# Patient Record
Sex: Female | Born: 1960 | Race: White | Hispanic: No | Marital: Married | Smoking: Never smoker
Health system: Southern US, Community
[De-identification: ages and names within clinical notes are randomized; demographics above are authoritative.]

## PROBLEM LIST (undated history)

## (undated) DIAGNOSIS — E079 Disorder of thyroid, unspecified: Secondary | ICD-10-CM

## (undated) HISTORY — PX: CHOLECYSTECTOMY: SHX55

## (undated) HISTORY — PX: THYROIDECTOMY: SHX17

---

## 2015-07-03 DIAGNOSIS — E669 Obesity, unspecified: Secondary | ICD-10-CM

## 2015-07-03 DIAGNOSIS — J309 Allergic rhinitis, unspecified: Secondary | ICD-10-CM | POA: Insufficient documentation

## 2015-07-03 DIAGNOSIS — E039 Hypothyroidism, unspecified: Secondary | ICD-10-CM

## 2015-07-03 HISTORY — DX: Obesity, unspecified: E66.9

## 2015-07-03 HISTORY — DX: Hypothyroidism, unspecified: E03.9

## 2015-07-03 HISTORY — DX: Allergic rhinitis, unspecified: J30.9

## 2015-07-14 DIAGNOSIS — R072 Precordial pain: Secondary | ICD-10-CM

## 2015-07-14 DIAGNOSIS — E890 Postprocedural hypothyroidism: Secondary | ICD-10-CM

## 2015-07-14 DIAGNOSIS — K219 Gastro-esophageal reflux disease without esophagitis: Secondary | ICD-10-CM

## 2015-07-14 HISTORY — DX: Gastro-esophageal reflux disease without esophagitis: K21.9

## 2015-07-14 HISTORY — DX: Precordial pain: R07.2

## 2015-07-14 HISTORY — DX: Postprocedural hypothyroidism: E89.0

## 2017-08-25 DIAGNOSIS — F325 Major depressive disorder, single episode, in full remission: Secondary | ICD-10-CM

## 2017-08-25 HISTORY — DX: Major depressive disorder, single episode, in full remission: F32.5

## 2019-05-09 ENCOUNTER — Ambulatory Visit: Payer: Self-pay | Attending: Internal Medicine

## 2019-05-09 DIAGNOSIS — Z23 Encounter for immunization: Secondary | ICD-10-CM

## 2019-05-09 NOTE — Progress Notes (Signed)
   Covid-19 Vaccination Clinic  Name:  Judith Joseph    MRN: [id number] DOB: [birth year]  05/09/2019  [name] was observed post Covid-19 immunization for 15 minutes without incident. She was provided with Vaccine Information Sheet and instruction to access the V-Safe system.   [name] was instructed to call 911 with any severe reactions post vaccine: Marland Kitchen Difficulty breathing  . Swelling of face and throat  . A fast heartbeat  . A bad rash all over body  . Dizziness and weakness   Immunizations Administered    Name Date Dose VIS Date Route   Pfizer COVID-19 Vaccine 05/09/2019  2:56 PM 0.3 mL 02/08/2019 Intramuscular   Manufacturer: [employer], [employer]   Lot: [id number]   NDC: [address]-2

## 2019-06-04 ENCOUNTER — Ambulatory Visit: Payer: Self-pay | Attending: Internal Medicine

## 2019-06-04 DIAGNOSIS — Z23 Encounter for immunization: Secondary | ICD-10-CM

## 2019-06-04 NOTE — Progress Notes (Signed)
   Covid-19 Vaccination Clinic  Name:  Judith Joseph    MRN: [id number] DOB: [birth year]  06/04/2019  Judith Joseph was observed post Covid-19 immunization for 15 minutes without incident. She was provided with Vaccine Information Sheet and instruction to access the V-Safe system.   Judith Joseph was instructed to call 911 with any severe reactions post vaccine: Marland Kitchen Difficulty breathing  . Swelling of face and throat  . A fast heartbeat  . A bad rash all over body  . Dizziness and weakness   Immunizations Administered    Name Date Dose VIS Date Route   Pfizer COVID-19 Vaccine 06/04/2019  2:44 PM 0.3 mL 02/08/2019 Intramuscular   Manufacturer: [employer], [employer]   Lot: [id number]   NDC: [address]-0

## 2019-07-24 ENCOUNTER — Emergency Department (HOSPITAL_BASED_OUTPATIENT_CLINIC_OR_DEPARTMENT_OTHER): Payer: 59

## 2019-07-24 ENCOUNTER — Other Ambulatory Visit: Payer: Self-pay

## 2019-07-24 ENCOUNTER — Emergency Department (HOSPITAL_BASED_OUTPATIENT_CLINIC_OR_DEPARTMENT_OTHER)
Admission: EM | Admit: 2019-07-24 | Discharge: 2019-07-24 | Disposition: A | Discharge status: Home / Self Care | Payer: 59 | Attending: Emergency Medicine | Admitting: Emergency Medicine

## 2019-07-24 ENCOUNTER — Encounter (HOSPITAL_BASED_OUTPATIENT_CLINIC_OR_DEPARTMENT_OTHER): Payer: Self-pay | Admitting: Emergency Medicine

## 2019-07-24 DIAGNOSIS — I447 Left bundle-branch block, unspecified: Secondary | ICD-10-CM | POA: Insufficient documentation

## 2019-07-24 DIAGNOSIS — R079 Chest pain, unspecified: Secondary | ICD-10-CM | POA: Diagnosis present

## 2019-07-24 DIAGNOSIS — Z79899 Other long term (current) drug therapy: Secondary | ICD-10-CM | POA: Diagnosis not present

## 2019-07-24 DIAGNOSIS — Z7982 Long term (current) use of aspirin: Secondary | ICD-10-CM | POA: Insufficient documentation

## 2019-07-24 HISTORY — DX: Disorder of thyroid, unspecified: E07.9

## 2019-07-24 LAB — COMPREHENSIVE METABOLIC PANEL
ALT: 12 U/L (ref 0–44)
AST: 18 U/L (ref 15–41)
Albumin: 3.6 g/dL (ref 3.5–5.0)
Alkaline Phosphatase: 79 U/L (ref 38–126)
Anion gap: 12 (ref 5–15)
BUN: 14 mg/dL (ref 6–20)
CO2: 25 mmol/L (ref 22–32)
Calcium: 8.2 mg/dL — ABNORMAL LOW (ref 8.9–10.3)
Chloride: 100 mmol/L (ref 98–111)
Creatinine, Ser: 0.96 mg/dL (ref 0.44–1.00)
GFR calc Af Amer: >60 mL/min (ref >60)
GFR calc non Af Amer: >60 mL/min (ref >60)
Glucose, Bld: 98 mg/dL (ref 70–99)
Potassium: 3.4 mmol/L — ABNORMAL LOW (ref 3.5–5.1)
Sodium: 137 mmol/L (ref 135–145)
Total Bilirubin: 0.5 mg/dL (ref 0.3–1.2)
Total Protein: 6.4 g/dL — ABNORMAL LOW (ref 6.5–8.1)

## 2019-07-24 LAB — CBC WITH DIFFERENTIAL/PLATELET
Abs Immature Granulocytes: 0.01 10*3/uL (ref 0.00–0.07)
Basophils Absolute: 0.1 10*3/uL (ref 0.0–0.1)
Basophils Relative: 1 %
Eosinophils Absolute: 0.2 10*3/uL (ref 0.0–0.5)
Eosinophils Relative: 3 %
HCT: 36.5 % (ref 36.0–46.0)
Hemoglobin: 11.8 g/dL — ABNORMAL LOW (ref 12.0–15.0)
Immature Granulocytes: 0 %
Lymphocytes Relative: 45 %
Lymphs Abs: 3.2 10*3/uL (ref 0.7–4.0)
MCH: 26.7 pg (ref 26.0–34.0)
MCHC: 32.3 g/dL (ref 30.0–36.0)
MCV: 82.6 fL (ref 80.0–100.0)
Monocytes Absolute: 0.5 10*3/uL (ref 0.1–1.0)
Monocytes Relative: 7 %
Neutro Abs: 3.2 10*3/uL (ref 1.7–7.7)
Neutrophils Relative %: 44 %
Platelets: 340 10*3/uL (ref 150–400)
RBC: 4.42 MIL/uL (ref 3.87–5.11)
RDW: 13.5 % (ref 11.5–15.5)
WBC: 7.2 10*3/uL (ref 4.0–10.5)
nRBC: 0 % (ref 0.0–0.2)

## 2019-07-24 LAB — TROPONIN I (HIGH SENSITIVITY)
Troponin I (High Sensitivity): 3 ng/L (ref <18)
Troponin I (High Sensitivity): 4 ng/L (ref <18)

## 2019-07-24 MED ORDER — ASPIRIN 81 MG PO CHEW
324.0000 mg | CHEWABLE_TABLET | Freq: Once | ORAL | Status: AC
  Administered 2019-07-24: 324 mg via ORAL
  Filled 2019-07-24: qty 4

## 2019-07-24 MED ORDER — NITROGLYCERIN 0.4 MG SL SUBL
0.4000 mg | SUBLINGUAL_TABLET | SUBLINGUAL | Status: DC | PRN
  Administered 2019-07-24 (×3): 0.4 mg via SUBLINGUAL
  Filled 2019-07-24: qty 1

## 2019-07-24 NOTE — ED Notes (Signed)
MD informed of chest pain 4/10. Left side with movement. Denies jaw or arm pain.

## 2019-07-24 NOTE — ED Triage Notes (Signed)
Pt also states she had intermittent pain in legs with weakness in legs over last several days.

## 2019-07-24 NOTE — Discharge Instructions (Addendum)
Call the cardiology group this morning to schedule outpatient follow-up for further evaluation of your chest pain.  Continue to take your aspirin daily.  If you have any worsening chest pain symptoms please go to the ER at [facility] for repeat evaluation.

## 2019-07-24 NOTE — ED Provider Notes (Signed)
[facility] EMERGENCY DEPARTMENT Provider Note   CSN: [id number] Arrival date & time: 07/24/19  0112     History Chief Complaint  Patient presents with  . Chest Pain    Judith Joseph is a 59 y.o. female.  Patient presents to the emergency department for evaluation of left-sided chest pain that radiates to the left arm and into her left jaw.  Symptoms began 2 hours ago while she was sitting and watching TV.  She feels like she might be very slightly short of breath.  No pleuritic pain.  She did not get nausea or diaphoresis with the pain.  She has not noticed any worsening with exertion.  She reports that she is fairly active at work, walks throughout the course of the day.  Has never developed exertional chest pain but over the last couple of weeks has felt more fatigued than usual and intermittently has been experiencing aching pains in her legs.     HPI: A 59 year old patient with a history of obesity presents for evaluation of chest pain. Initial onset of pain was approximately 1-3 hours ago. The patient's chest pain is not worse with exertion. The patient's chest pain is middle- or left-sided, is not well-localized, is not described as heaviness/pressure/tightness, is not sharp and does radiate to the arms/jaw/neck. The patient does not complain of nausea and denies diaphoresis. The patient has no history of stroke, has no history of peripheral artery disease, has not smoked in the past 90 days, denies any history of treated diabetes, has no relevant family history of coronary artery disease (first degree relative at less than age 55), is not hypertensive and has no history of hypercholesterolemia.   Past Medical History:  Diagnosis Date  . Thyroid disease     There are no problems to display for this patient.   Past Surgical History:  Procedure Laterality Date  . CESAREAN SECTION    . CHOLECYSTECTOMY    . THYROIDECTOMY       OB History   No obstetric history  on file.     No family history on file.  Social History   Tobacco Use  . Smoking status: Never Smoker  . Smokeless tobacco: Never Used  Substance Use Topics  . Alcohol use: Never  . Drug use: Never    Home Medications Prior to Admission medications   Medication Sig Start Date End Date Taking? Authorizing Provider  aspirin 81 MG EC tablet Take by mouth.    [provider]  buPROPion (WELLBUTRIN XL) 300 MG 24 hr tablet Take 300 mg by mouth every morning. 04/15/19   [provider]  Cholecalciferol 25 MCG (1000 UT) tablet Take by mouth.    [provider]  levothyroxine (SYNTHROID) 150 MCG tablet Take 150 mcg by mouth daily. 03/31/19   [provider]    Allergies    Ciprofloxacin  Review of Systems   Review of Systems  Cardiovascular: Positive for chest pain.  All other systems reviewed and are negative.   Physical Exam Updated Vital Signs BP (!) 109/52   Pulse (!) 59   Temp 98 F (36.7 C) (Oral)   Resp 14   Ht 5\' 4"  (1.626 m)   Wt 81.6 kg   SpO2 97%   BMI 30.90 kg/m   Physical Exam Vitals and nursing note reviewed.  Constitutional:      General: She is not in acute distress.    Appearance: Normal appearance. She is well-developed.  HENT:  Head: Normocephalic and atraumatic.     Right Ear: Hearing normal.     Left Ear: Hearing normal.     Nose: Nose normal.  Eyes:     Conjunctiva/sclera: Conjunctivae normal.     Pupils: Pupils are equal, round, and reactive to light.  Cardiovascular:     Rate and Rhythm: Regular rhythm.     Pulses:          Dorsalis pedis pulses are 2+ on the right side and 2+ on the left side.     Heart sounds: S1 normal and S2 normal. No murmur. No friction rub. No gallop.   Pulmonary:     Effort: Pulmonary effort is normal. No respiratory distress.     Breath sounds: Normal breath sounds.  Chest:     Chest wall: No tenderness.  Abdominal:     General: Bowel sounds are normal.     Palpations:  Abdomen is soft.     Tenderness: There is no abdominal tenderness. There is no guarding or rebound. Negative signs include Murphy's sign and McBurney's sign.     Hernia: No hernia is present.  Musculoskeletal:        General: Normal range of motion.     Cervical back: Normal range of motion and neck supple.     Right lower leg: No tenderness. No edema.     Left lower leg: No tenderness. No edema.  Skin:    General: Skin is warm and dry.     Findings: No rash.  Neurological:     Mental Status: She is alert and oriented to person, place, and time.     GCS: GCS eye subscore is 4. GCS verbal subscore is 5. GCS motor subscore is 6.     Cranial Nerves: No cranial nerve deficit.     Sensory: No sensory deficit.     Coordination: Coordination normal.  Psychiatric:        Speech: Speech normal.        Behavior: Behavior normal.        Thought Content: Thought content normal.     ED Results / Procedures / Treatments   Labs (all labs ordered are listed, but only abnormal results are displayed) Labs Reviewed  CBC WITH DIFFERENTIAL/PLATELET - Abnormal; Notable for the following components:      Result Value   Hemoglobin 11.8 (*)    All other components within normal limits  COMPREHENSIVE METABOLIC PANEL - Abnormal; Notable for the following components:   Potassium 3.4 (*)    Calcium 8.2 (*)    Total Protein 6.4 (*)    All other components within normal limits  TROPONIN I (HIGH SENSITIVITY)  TROPONIN I (HIGH SENSITIVITY)    EKG EKG Interpretation  Date/Time:  Wednesday Jul 24 2019 01:13:47 EDT Ventricular Rate:  75 PR Interval:  166 QRS Duration: 134 QT Interval:  410 QTC Calculation: 457 R Axis:   -41 Text Interpretation: Normal sinus rhythm Left axis deviation Left bundle branch block Abnormal ECG No previous tracing Confirmed by Gilda Crease [phone]) on 07/24/2019 1:23:08 AM   Radiology DG Chest Port 1 View  Result Date: 07/24/2019 CLINICAL DATA:  Left upper  chest pain. Left arm and jaw pain. EXAM: PORTABLE CHEST 1 VIEW COMPARISON:  Radiograph 07/14/2015 FINDINGS: The cardiomediastinal contours are normal. Calcified granuloma in the left upper lung, unchanged. Pulmonary vasculature is normal. No consolidation, pleural effusion, or pneumothorax. No acute osseous abnormalities are seen. Small surgical clips in the region of  the thoracic inlet. IMPRESSION: No acute chest findings. Electronically Signed   By: Narda Rutherford M.D.   On: 07/24/2019 01:44    Procedures Procedures (including critical care time)  Medications Ordered in ED Medications  nitroGLYCERIN (NITROSTAT) SL tablet 0.4 mg (0.4 mg Sublingual Given 07/24/19 0210)  aspirin chewable tablet 324 mg (324 mg Oral Given 07/24/19 0153)    ED Course  I have reviewed the triage vital signs and the nursing notes.  Pertinent labs & imaging results that were available during my care of the patient were reviewed by me and considered in my medical decision making (see chart for details).    MDM Rules/Calculators/A&P HEAR Score: 4                    Patient presents to the emergency department for evaluation of chest pain.  Patient had onset of left-sided upper chest pain that radiated into the neck and jaw area as well as arm 2 hours before coming to the ER.  The symptoms came on at rest while she was watching TV.  Symptoms are not exacerbated or reproducible with palpation, movement of the arm or neck.  Patient reports that she is fairly active at her job and has never had any exertional chest pain.  She does not have significant cardiac risk factors, however, EKG does show a left bundle branch block.  There is no comparison EKG available.  She has completed 2 troponins here in the ER that are both normal without any significant elevation from the first to the second.  Heart pathway algorithm indicates that patient would be appropriate for either observation admission or outpatient follow-up.  I had a  lengthy discussion with the patient and her husband about this.  I did stress that admission would be the safest way to proceed, however, I could not guarantee that she would have definitive testing such as stress test during the hospital stay.  She also would need to be transferred from med center to [facility] for work-up.  After a lengthy conversation she and her husband did opt for discharge with follow-up with cardiology.  She was counseled to return to the ER, or more specifically go to [facility], ER, if she has any worsening symptoms.  Final Clinical Impression(s) / ED Diagnoses Final diagnoses:  LBBB (left bundle branch block)  Chest pain, unspecified type    Rx / DC Orders ED Discharge Orders    None       Pollina, Canary Brim, MD 07/24/19 [phone]

## 2019-07-24 NOTE — ED Triage Notes (Addendum)
Pt c/o pin in upper left sided of chest that radiates down left arm and in to left jaw onset 2 hours ago while watching TV

## 2019-08-12 ENCOUNTER — Other Ambulatory Visit: Payer: Self-pay

## 2019-08-14 ENCOUNTER — Ambulatory Visit: Payer: 59 | Admitting: Cardiology

## 2019-08-14 ENCOUNTER — Other Ambulatory Visit: Payer: Self-pay

## 2019-08-14 ENCOUNTER — Encounter: Payer: Self-pay | Admitting: Cardiology

## 2019-08-14 DIAGNOSIS — I447 Left bundle-branch block, unspecified: Secondary | ICD-10-CM | POA: Diagnosis not present

## 2019-08-14 DIAGNOSIS — R0789 Other chest pain: Secondary | ICD-10-CM

## 2019-08-14 DIAGNOSIS — E785 Hyperlipidemia, unspecified: Secondary | ICD-10-CM | POA: Diagnosis not present

## 2019-08-14 MED ORDER — NITROGLYCERIN 0.4 MG SL SUBL
0.4000 mg | SUBLINGUAL_TABLET | SUBLINGUAL | 11 refills | Status: AC | PRN
Start: 2019-08-14 — End: 2019-11-12

## 2019-08-14 MED ORDER — METOPROLOL TARTRATE 25 MG PO TABS
12.5000 mg | ORAL_TABLET | Freq: Two times a day (BID) | ORAL | 1 refills | Status: DC
Start: 2019-08-14 — End: 2019-10-01

## 2019-08-14 NOTE — Progress Notes (Signed)
Cardiology Consultation:    Date:  08/14/2019   ID:  Judith Joseph, DOB 22-Oct-1960, MRN [id number]  PCP:  Dionne Bucy., MD  Cardiologist:  Jenne Campus, MD   Referring MD: Dionne Bucy., MD   Chief Complaint  Patient presents with  . New Patient (Initial Visit)  No chest pain  History of Present Illness:    Judith Joseph is a 59 y.o. female who is being seen today for the evaluation of chest pain at the request of Dionne Bucy., MD.  Few weeks ago she coming to the emergency room because of chest pain.  Apparently she was watching TV and she started having sensation in the left side of her chest going to his left arm as well as to the neck was no shortness of breath there was no sweating associated with this sensation.  She graded this sensation as 6-7 in scale 1-10.  She took few aspirins with not much relief and eventually ended up coming to the emergency room.  She was given nitroglycerin with questionable relief eventually pain subsided.  She did have 2 high sensitive troponin was done which were negative.  And eventually she was asked to stay in the hospital however she preferred to be managed as an outpatient.  She comes today to my office to discuss this issue.  She said that for last few months she has been under a lot of stress.  Her son had a wedding and it was a lot of preparation for it she did have few episodes of chest tightness however not related to exercise.  At the same time she is able to walk climb stairs but not much difficulties.  She is the visiting the emergency room few times he felt that sensation I did not start of her chest but cannot remember how long it lasted. She does have left bundle branch on EKG which was first discovered when she was in the emergency room. She does have mild dyslipidemia She never smoked Does have a history of family coronary artery disease which were premature. She does not exercise on a regular basis but she works  required physical exercise and have no difficulty doing it.  Past Medical History:  Diagnosis Date  . Acquired hypothyroidism 07/03/2015  . Allergic rhinitis 07/03/2015  . Gastroesophageal reflux disease without esophagitis 07/14/2015  . 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 Obesity 07/03/2015  . Postoperative hypothyroidism 07/14/2015  . Precordial pain 07/14/2015  . Thyroid disease     Past Surgical History:  Procedure Laterality Date  . CESAREAN SECTION    . CHOLECYSTECTOMY    . THYROIDECTOMY      Current Medications: Current Meds  Medication Sig  . aspirin 81 MG EC tablet Take by mouth.  Marland Kitchen buPROPion (WELLBUTRIN XL) 300 MG 24 hr tablet Take 300 mg by mouth every morning.  . Cholecalciferol 25 MCG (1000 UT) tablet Take by mouth.  . fluticasone (FLONASE) 50 MCG/ACT nasal spray Place 1 spray into both nostrils daily as needed.  Marland Kitchen levothyroxine (SYNTHROID) 150 MCG tablet Take 150 mcg by mouth daily.  Marland Kitchen omeprazole (PRILOSEC) 10 MG capsule Take 10 mg by mouth daily.  Marland Kitchen triamcinolone (KENALOG) 0.025 % ointment Apply 1 application topically 2 (two) times daily as needed.     Allergies:   Ciprofloxacin   Social History   Socioeconomic History  . Marital status: Unknown    Spouse name: Not on file  . Number of  children: Not on file  . Years of education: Not on file  . Highest education level: Not on file  Occupational History  . Not on file  Tobacco Use  . Smoking status: Never Smoker  . Smokeless tobacco: Never Used  Substance and Sexual Activity  . Alcohol use: Never  . Drug use: Never  . Sexual activity: Not on file  Other Topics Concern  . Not on file  Social History Narrative  . Not on file   Social Determinants of Health   Financial Resource Strain:   . Difficulty of Paying Living Expenses:   Food Insecurity:   . Worried About Programme researcher, broadcasting/film/video in the Last Year:   . Barista in the Last Year:   Transportation Needs:   . Automotive engineer (Medical):   Marland Kitchen Lack of Transportation (Non-Medical):   Physical Activity:   . Days of Exercise per Week:   . Minutes of Exercise per Session:   Stress:   . Feeling of Stress :   Social Connections:   . Frequency of Communication with Friends and Family:   . Frequency of Social Gatherings with Friends and Family:   . Attends Religious Services:   . Active Member of Clubs or Organizations:   . Attends Banker Meetings:   Marland Kitchen Marital Status:      Family History: The patient's family history includes Hypertension in her father; Stroke in her father. ROS:   Please see the history of present illness.    All 14 point review of systems negative except as described per history of present illness.  EKGs/Labs/Other Studies Reviewed:    The following studies were reviewed today:   EKG:  EKG is  ordered today.  The ekg ordered today demonstrates normal sinus rhythm, normal P interval, left bundle branch block  Recent Labs: 07/24/2019: ALT 12; BUN 14; Creatinine, Ser 0.96; Hemoglobin 11.8; Platelets 340; Potassium 3.4; Sodium 137  Recent Lipid Panel No results found for: CHOL, TRIG, HDL, CHOLHDL, VLDL, LDLCALC, LDLDIRECT  Physical Exam:    VS:  BP 118/76   Pulse 81   Ht 5\' 4"  (1.626 m)   Wt 181 lb (82.1 kg)   SpO2 99%   BMI 31.07 kg/m     Wt Readings from Last 3 Encounters:  08/14/19 181 lb (82.1 kg)  07/24/19 180 lb (81.6 kg)     GEN:  Well nourished, well developed in no acute distress HEENT: Normal NECK: No JVD; No carotid bruits LYMPHATICS: No lymphadenopathy CARDIAC: RRR, no murmurs, no rubs, no gallops RESPIRATORY:  Clear to auscultation without rales, wheezing or rhonchi  ABDOMEN: Soft, non-tender, non-distended MUSCULOSKELETAL:  No edema; No deformity  SKIN: Warm and dry NEUROLOGIC:  Alert and oriented x 3 PSYCHIATRIC:  Normal affect   ASSESSMENT:    1. Left bundle branch block   2. Atypical chest pain   3. Dyslipidemia    PLAN:      In order of problems listed above:  1. Atypical chest pain.  Lasting for hours her biochemical markers are negative.  Surprisingly she does have left bundle branch block on EKG and we do not know how long she have this phenomenon.  We talked about options for this situation.  Option being cardiac catheterization versus stress testing.  She is leaning more towards stress testing.  The plan will be to do echocardiogram if echocardiogram will show relatively preserved left ventricle ejection fraction and stress test will be  done, if echocardiogram showed significant diminished left ventricle ejection fraction but cardiac catheterization will be pursued.  She agreed with this plan.  I gave her prescription for nitroglycerin, she is already on aspirin, I will give her metoprolol 12.5 twice daily.  Also told her to go to the emergency room if pain is not relieved by nitroglycerin. 2. Left bundle branch block: It is a new discomfort.  We actually do not know for how long she has been having this phenomenon this is a new findings.  Need to be investigated.  She will have echocardiogram done. 3. Dyslipidemia: She did have her fasting lipid profile done last year.  Will return to test.   Medication Adjustments/Labs and Tests Ordered: Current medicines are reviewed at length with the patient today.  Concerns regarding medicines are outlined above.  No orders of the defined types were placed in this encounter.  No orders of the defined types were placed in this encounter.   Signed, Georgeanna Lea, MD, [facility]. 08/14/2019 11:46 AM    [facility]

## 2019-08-14 NOTE — Patient Instructions (Signed)
Medication Instructions:  Your physician has recommended you make the following change in your medication:  START: Metoprolol tartrate 12.5 mg twice daily   TAKE AS NEEDED FOR CHEST PAIN: Nitroglycerin 0.4 mg sublingual (under your tongue) as needed for chest pain. If experiencing chest pain, stop what you are doing and sit down. Take 1 nitroglycerin and wait 5 minutes. If chest pain continues, take another nitroglycerin and wait 5 minutes. If chest pain does not subside, take 1 more nitroglycerin and dial 911. You make take a total of 3 nitroglycerin in a 15 minute time frame.    *If you need a refill on your cardiac medications before your next appointment, please call your pharmacy*   Lab Work: None.  If you have labs (blood work) drawn today and your tests are completely normal, you will receive your results only by:  [place] (if you have MyChart) OR  A paper copy in the mail If you have any lab test that is abnormal or we need to change your treatment, we will call you to review the results.   Testing/Procedures: Your physician has requested that you have an echocardiogram. Echocardiography is a painless test that uses sound waves to create images of your heart. It provides your doctor with information about the size and shape of your heart and how well your hearts chambers and valves are working. This procedure takes approximately one hour. There are no restrictions for this procedure.    [facility] at [facility] [address], [place], [place] [address] Phone: [phone]    Please arrive 15 minutes prior to your appointment time for registration and insurance purposes.  The test will take approximately 3 to 4 hours to complete; you may bring reading material.  If someone comes with you to your appointment, they will need to remain in the main lobby due to limited space in the testing area. **If you are pregnant or  breastfeeding, please notify the nuclear lab prior to your appointment**  How to prepare for your Myocardial Perfusion Test:  Do not eat or drink 3 hours prior to your test, except you may have water.  Do not consume products containing caffeine (regular or decaffeinated) 12 hours prior to your test. (ex: coffee, chocolate, sodas, tea).  Do bring a list of your current medications with you.  If not listed below, you may take your medications as normal.  Do wear comfortable clothes (no dresses or overalls) and walking shoes, tennis shoes preferred (No heels or open toe shoes are allowed).  Do NOT wear cologne, perfume, aftershave, or lotions (deodorant is allowed).  If these instructions are not followed, your test will have to be rescheduled.  Please report to [address], [address] for your test.  If you have questions or concerns about your appointment, you can call the Nuclear Lab at [phone].  If you cannot keep your appointment, please provide 24 hours notification to the Nuclear Lab, to avoid a possible $50 charge to your account.    Follow-Up: At [facility], you and your health needs are our priority.  As part of our continuing mission to provide you with exceptional heart care, we have created designated Provider Care Teams.  These Care Teams include your primary Cardiologist (physician) and Advanced Practice Providers (APPs -  Physician Assistants and Nurse Practitioners) who all work together to provide you with the care you need, when you need it.  We recommend signing up  for the patient portal called "MyChart".  Sign up information is provided on this After Visit Summary.  MyChart is used to connect with patients for Virtual Visits (Telemedicine).  Patients are able to view lab/test results, encounter notes, upcoming appointments, etc.  Non-urgent messages can be sent to your provider as well.   To learn more about what you can do with MyChart, go to  [email or web address].    Your next appointment:   1 month(s)  The format for your next appointment:   In Person  Provider:   Gypsy Balsam, MD   Other Instructions   Echocardiogram An echocardiogram is a procedure that uses painless sound waves (ultrasound) to produce an image of the heart. Images from an echocardiogram can provide important information about:  Signs of coronary artery disease (CAD).  Aneurysm detection. An aneurysm is a weak or damaged part of an artery wall that bulges out from the normal force of blood pumping through the body.  Heart size and shape. Changes in the size or shape of the heart can be associated with certain conditions, including heart failure, aneurysm, and CAD.  Heart muscle function.  Heart valve function.  Signs of a past heart attack.  Fluid buildup around the heart.  Thickening of the heart muscle.  A tumor or infectious growth around the heart valves. Tell a health care provider about:  Any allergies you have.  All medicines you are taking, including vitamins, herbs, eye drops, creams, and over-the-counter medicines.  Any blood disorders you have.  Any surgeries you have had.  Any medical conditions you have.  Whether you are pregnant or may be pregnant. What are the risks? Generally, this is a safe procedure. However, problems may occur, including:  Allergic reaction to dye (contrast) that may be used during the procedure. What happens before the procedure? No specific preparation is needed. You may eat and drink normally. What happens during the procedure?   An IV tube may be inserted into one of your veins.  You may receive contrast through this tube. A contrast is an injection that improves the quality of the pictures from your heart.  A gel will be applied to your chest.  A wand-like tool (transducer) will be moved over your chest. The gel will help to transmit the sound waves from the  transducer.  The sound waves will harmlessly bounce off of your heart to allow the heart images to be captured in real-time motion. The images will be recorded on a computer. The procedure may vary among health care providers and hospitals. What happens after the procedure?  You may return to your normal, everyday life, including diet, activities, and medicines, unless your health care provider tells you not to do that. Summary  An echocardiogram is a procedure that uses painless sound waves (ultrasound) to produce an image of the heart.  Images from an echocardiogram can provide important information about the size and shape of your heart, heart muscle function, heart valve function, and fluid buildup around your heart.  You do not need to do anything to prepare before this procedure. You may eat and drink normally.  After the echocardiogram is completed, you may return to your normal, everyday life, unless your health care provider tells you not to do that. This information is not intended to replace advice given to you by your health care provider. Make sure you discuss any questions you have with your health care provider. Document Revised: 06/07/2018 Document Reviewed: 03/19/2016 Elsevier Patient  Education  [employer].   Cardiac Nuclear Scan A cardiac nuclear scan is a test that measures blood flow to the heart when a person is resting and when he or she is exercising. The test looks for problems such as:  Not enough blood reaching a portion of the heart.  The heart muscle not working normally. You may need this test if:  You have heart disease.  You have had abnormal lab results.  You have had heart surgery or a balloon procedure to open up blocked arteries (angioplasty).  You have chest pain.  You have shortness of breath. In this test, a radioactive dye (tracer) is injected into your bloodstream. After the tracer has traveled to your heart, an imaging device is  used to measure how much of the tracer is absorbed by or distributed to various areas of your heart. This procedure is usually done at a hospital and takes 2-4 hours. Tell a health care provider about:  Any allergies you have.  All medicines you are taking, including vitamins, herbs, eye drops, creams, and over-the-counter medicines.  Any problems you or family members have had with anesthetic medicines.  Any blood disorders you have.  Any surgeries you have had.  Any medical conditions you have.  Whether you are pregnant or may be pregnant. What are the risks? Generally, this is a safe procedure. However, problems may occur, including:  Serious chest pain and heart attack. This is only a risk if the stress portion of the test is done.  Rapid heartbeat.  Sensation of warmth in your chest. This usually passes quickly.  Allergic reaction to the tracer. What happens before the procedure?  Ask your health care provider about changing or stopping your regular medicines. This is especially important if you are taking diabetes medicines or blood thinners.  Follow instructions from your health care provider about eating or drinking restrictions.  Remove your jewelry on the day of the procedure. What happens during the procedure?  An IV will be inserted into one of your veins.  Your health care provider will inject a small amount of radioactive tracer through the IV.  You will wait for 20-40 minutes while the tracer travels through your bloodstream.  Your heart activity will be monitored with an electrocardiogram (ECG).  You will lie down on an exam table.  Images of your heart will be taken for about 15-20 minutes.  You may also have a stress test. For this test, one of the following may be done: ? You will exercise on a treadmill or stationary bike. While you exercise, your heart's activity will be monitored with an ECG, and your blood pressure will be checked. ? You will be  given medicines that will increase blood flow to parts of your heart. This is done if you are unable to exercise.  When blood flow to your heart has peaked, a tracer will again be injected through the IV.  After 20-40 minutes, you will get back on the exam table and have more images taken of your heart.  Depending on the type of tracer used, scans may need to be repeated 3-4 hours later.  Your IV line will be removed when the procedure is over. The procedure may vary among health care providers and hospitals. What happens after the procedure?  Unless your health care provider tells you otherwise, you may return to your normal schedule, including diet, activities, and medicines.  Unless your health care provider tells you otherwise, you  may increase your fluid intake. This will help to flush the contrast dye from your body. Drink enough fluid to keep your urine pale yellow.  Ask your health care provider, or the department that is doing the test: ? When will my results be ready? ? How will I get my results? Summary  A cardiac nuclear scan measures the blood flow to the heart when a person is resting and when he or she is exercising.  Tell your health care provider if you are pregnant.  Before the procedure, ask your health care provider about changing or stopping your regular medicines. This is especially important if you are taking diabetes medicines or blood thinners.  After the procedure, unless your health care provider tells you otherwise, increase your fluid intake. This will help flush the contrast dye from your body.  After the procedure, unless your health care provider tells you otherwise, you may return to your normal schedule, including diet, activities, and medicines. This information is not intended to replace advice given to you by your health care provider. Make sure you discuss any questions you have with your health care provider. Document Revised: 07/31/2017 Document  Reviewed: 07/31/2017 Elsevier Patient Education  2020 [employer].

## 2019-08-15 ENCOUNTER — Ambulatory Visit (INDEPENDENT_AMBULATORY_CARE_PROVIDER_SITE_OTHER): Payer: 59

## 2019-08-15 DIAGNOSIS — R0789 Other chest pain: Secondary | ICD-10-CM | POA: Diagnosis not present

## 2019-08-15 DIAGNOSIS — I447 Left bundle-branch block, unspecified: Secondary | ICD-10-CM | POA: Diagnosis not present

## 2019-08-15 NOTE — Progress Notes (Signed)
Complete echocardiogram performed.  Jimmy Gryffin Altice RDCS, RVT  

## 2019-09-04 ENCOUNTER — Telehealth (HOSPITAL_COMMUNITY): Payer: Self-pay | Admitting: *Deleted

## 2019-09-04 NOTE — Telephone Encounter (Signed)
Patient given detailed instructions per Myocardial Perfusion Study Information Sheet for the test on 7/921 at 10:30. Patient notified to arrive 15 minutes early and that it is imperative to arrive on time for appointment to keep from having the test rescheduled.  If you need to cancel or reschedule your appointment, please call the office within 24 hours of your appointment. . Patient verbalized understanding.Daneil Dolin

## 2019-09-06 ENCOUNTER — Other Ambulatory Visit (HOSPITAL_COMMUNITY): Payer: 59

## 2019-09-06 ENCOUNTER — Ambulatory Visit (HOSPITAL_COMMUNITY): Payer: 59 | Attending: Cardiology

## 2019-09-06 ENCOUNTER — Other Ambulatory Visit: Payer: Self-pay

## 2019-09-06 DIAGNOSIS — I447 Left bundle-branch block, unspecified: Secondary | ICD-10-CM | POA: Insufficient documentation

## 2019-09-06 DIAGNOSIS — R0789 Other chest pain: Secondary | ICD-10-CM | POA: Diagnosis present

## 2019-09-06 LAB — MYOCARDIAL PERFUSION IMAGING
LV dias vol: 74 mL (ref 46–106)
LV sys vol: 33 mL
Peak HR: 87 {beats}/min
Rest HR: 57 {beats}/min
SDS: 1
SRS: 1
SSS: 2
TID: 0.98

## 2019-09-06 MED ORDER — REGADENOSON 0.4 MG/5ML IV SOLN
0.4000 mg | Freq: Once | INTRAVENOUS | Status: AC
  Administered 2019-09-06: 0.4 mg via INTRAVENOUS

## 2019-09-06 MED ORDER — TECHNETIUM TC 99M TETROFOSMIN IV KIT
31.9000 | PACK | Freq: Once | INTRAVENOUS | Status: AC | PRN
  Administered 2019-09-06: 31.9 via INTRAVENOUS
  Filled 2019-09-06: qty 32

## 2019-09-06 MED ORDER — TECHNETIUM TC 99M TETROFOSMIN IV KIT
10.4000 | PACK | Freq: Once | INTRAVENOUS | Status: AC | PRN
  Administered 2019-09-06: 10.4 via INTRAVENOUS
  Filled 2019-09-06: qty 11

## 2019-10-01 ENCOUNTER — Ambulatory Visit: Payer: 59 | Admitting: Cardiology

## 2019-10-01 ENCOUNTER — Other Ambulatory Visit: Payer: Self-pay

## 2019-10-01 ENCOUNTER — Encounter: Payer: Self-pay | Admitting: Cardiology

## 2019-10-01 VITALS — BP 126/76 | HR 69 | Ht 64.0 in | Wt 181.4 lb

## 2019-10-01 DIAGNOSIS — R0789 Other chest pain: Secondary | ICD-10-CM | POA: Diagnosis not present

## 2019-10-01 DIAGNOSIS — E785 Hyperlipidemia, unspecified: Secondary | ICD-10-CM

## 2019-10-01 DIAGNOSIS — I447 Left bundle-branch block, unspecified: Secondary | ICD-10-CM

## 2019-10-01 MED ORDER — METOPROLOL SUCCINATE ER 25 MG PO TB24: 25.0000 mg | ORAL_TABLET | Freq: Every day | ORAL | 1 refills | Status: AC

## 2019-10-01 NOTE — Addendum Note (Signed)
Addended by: Lita Mains on: 10/01/2019 03:25 PM   Modules accepted: Orders

## 2019-10-01 NOTE — Patient Instructions (Addendum)
Medication Instructions:  Your physician has recommended you make the following change in your medication:   STOP: Metoprolol tartrate   START: Metoprolol succinate 25 mg daily  *If you need a refill on your cardiac medications before your next appointment, please call your pharmacy*   Lab Work: Your physician recommends that you return for lab work when fasting: lipid   If you have labs (blood work) drawn today and your tests are completely normal, you will receive your results only by: [place] MyChart Message (if you have MyChart) OR . A paper copy in the mail If you have any lab test that is abnormal or we need to change your treatment, we will call you to review the results.   Testing/Procedures: None.    Follow-Up: At [facility], you and your health needs are our priority.  As part of our continuing mission to provide you with exceptional heart care, we have created designated Provider Care Teams.  These Care Teams include your primary Cardiologist (physician) and Advanced Practice Providers (APPs -  Physician Assistants and Nurse Practitioners) who all work together to provide you with the care you need, when you need it.  We recommend signing up for the patient portal called "MyChart".  Sign up information is provided on this After Visit Summary.  MyChart is used to connect with patients for Virtual Visits (Telemedicine).  Patients are able to view lab/test results, encounter notes, upcoming appointments, etc.  Non-urgent messages can be sent to your provider as well.   To learn more about what you can do with MyChart, go to [email or web address].    Your next appointment:   5 month(s)  The format for your next appointment:   In Person  Provider:   Gypsy Balsam, MD   Other Instructions

## 2019-10-01 NOTE — Progress Notes (Signed)
Cardiology Office Note:    Date:  10/01/2019   ID:  Judith Joseph, DOB February 05, 1961, MRN [id number]  PCP:  Elspeth Cho., MD  Cardiologist:  Gypsy Balsam, MD    Referring MD: Elspeth Cho., MD   No chief complaint on file. I am doing better  History of Present Illness:    Judith Joseph is a 59 y.o. female he was referred to me because of atypical chest pain.  When she was in the emergency room she ruled out for myocardial infarction, however she was found to have left bundle branch block which was incidentally discovered.  She was referred to me.  She is doing well but she described to have very stressful situation at home.  She got her brother living with her as well as with her husband who does have some disability.  Does a lot of stressful situations at home.  She did have a stress test done which showed no evidence of ischemia, echocardiogram showed relatively preserved left ventricle ejection fraction.  Overall there is no explanation on this testing for her symptoms.  She denies having any recent chest pain tightness squeezing pressure burning chest.  Past Medical History:  Diagnosis Date  . Acquired hypothyroidism 07/03/2015  . Allergic rhinitis 07/03/2015  . Gastroesophageal reflux disease without esophagitis 07/14/2015  . 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 Obesity 07/03/2015  . Postoperative hypothyroidism 07/14/2015  . Precordial pain 07/14/2015  . Thyroid disease     Past Surgical History:  Procedure Laterality Date  . CESAREAN SECTION    . CHOLECYSTECTOMY    . THYROIDECTOMY      Current Medications: Current Meds  Medication Sig  . aspirin 81 MG EC tablet Take by mouth.  Marland Kitchen buPROPion (WELLBUTRIN XL) 300 MG 24 hr tablet Take 300 mg by mouth every morning.  . Cholecalciferol 25 MCG (1000 UT) tablet Take by mouth.  . fluticasone (FLONASE) 50 MCG/ACT nasal spray Place 1 spray into both nostrils daily as needed.  Marland Kitchen levothyroxine (SYNTHROID) 150 MCG  tablet Take 150 mcg by mouth daily.  . metoprolol tartrate (LOPRESSOR) 25 MG tablet Take 0.5 tablets (12.5 mg total) by mouth 2 (two) times daily.  . nitroGLYCERIN (NITROSTAT) 0.4 MG SL tablet Place 1 tablet (0.4 mg total) under the tongue every 5 (five) minutes as needed for chest pain.  Marland Kitchen omeprazole (PRILOSEC) 10 MG capsule Take 10 mg by mouth daily.  Marland Kitchen triamcinolone (KENALOG) 0.025 % ointment Apply 1 application topically 2 (two) times daily as needed.     Allergies:   Ciprofloxacin   Social History   Socioeconomic History  . Marital status: Married    Spouse name: Not on file  . Number of children: Not on file  . Years of education: Not on file  . Highest education level: Not on file  Occupational History  . Not on file  Tobacco Use  . Smoking status: Never Smoker  . Smokeless tobacco: Never Used  Substance and Sexual Activity  . Alcohol use: Never  . Drug use: Never  . Sexual activity: Not on file  Other Topics Concern  . Not on file  Social History Narrative  . Not on file   Social Determinants of Health   Financial Resource Strain:   . Difficulty of Paying Living Expenses:   Food Insecurity:   . Worried About Programme researcher, broadcasting/film/video in the Last Year:   . [employer] of Food in the Last Year:  Transportation Needs:   . Freight forwarder (Medical):   Marland Kitchen Lack of Transportation (Non-Medical):   Physical Activity:   . Days of Exercise per Week:   . Minutes of Exercise per Session:   Stress:   . Feeling of Stress :   Social Connections:   . Frequency of Communication with Friends and Family:   . Frequency of Social Gatherings with Friends and Family:   . Attends Religious Services:   . Active Member of Clubs or Organizations:   . Attends Banker Meetings:   Marland Kitchen Marital Status:      Family History: The patient's family history includes Hypertension in her father; Stroke in her father. ROS:   Please see the history of present illness.    All 14 point  review of systems negative except as described per history of present illness  EKGs/Labs/Other Studies Reviewed:      Recent Labs: 07/24/2019: ALT 12; BUN 14; Creatinine, Ser 0.96; Hemoglobin 11.8; Platelets 340; Potassium 3.4; Sodium 137  Recent Lipid Panel No results found for: CHOL, TRIG, HDL, CHOLHDL, VLDL, LDLCALC, LDLDIRECT  Physical Exam:    VS:  BP 126/76 (BP Location: Left Arm, Patient Position: Sitting, Cuff Size: Normal)   Pulse 69   Ht 5\' 4"  (1.626 m)   Wt 181 lb 6.4 oz (82.3 kg)   SpO2 94%   BMI 31.14 kg/m     Wt Readings from Last 3 Encounters:  10/01/19 181 lb 6.4 oz (82.3 kg)  09/06/19 181 lb (82.1 kg)  08/14/19 181 lb (82.1 kg)     GEN:  Well nourished, well developed in no acute distress HEENT: Normal NECK: No JVD; No carotid bruits LYMPHATICS: No lymphadenopathy CARDIAC: RRR, no murmurs, no rubs, no gallops RESPIRATORY:  Clear to auscultation without rales, wheezing or rhonchi  ABDOMEN: Soft, non-tender, non-distended MUSCULOSKELETAL:  No edema; No deformity  SKIN: Warm and dry LOWER EXTREMITIES: no swelling NEUROLOGIC:  Alert and oriented x 3 PSYCHIATRIC:  Normal affect   ASSESSMENT:    1. Dyslipidemia   2. Left bundle branch block   3. Atypical chest pain    PLAN:    In order of problems listed above:  1. Left bundle branch block work-up negative echocardiogram showed relatively preserved left ventricle ejection fraction, stress test showed no evidence of ischemia, will switch her to long-acting form of beta-blocker.  We will repeat echocardiogram in about a year if echocardiogram shows still preserved ejection fraction we will continue present management if not we will consider adding ARB or ACE inhibitor.  She does not have signs and symptoms of CHF. 2. Atypical chest pain: Stress test analyzed with the patient negative. 3. Dyslipidemia: We will check her fasting lipid profile   Medication Adjustments/Labs and Tests Ordered: Current  medicines are reviewed at length with the patient today.  Concerns regarding medicines are outlined above.  Orders Placed This Encounter  Procedures  . Lipid panel   Medication changes: No orders of the defined types were placed in this encounter.   Signed, 08/16/19, MD, [facility] 10/01/2019 3:19 PM    [facility]

## 2019-10-04 LAB — LIPID PANEL
Chol/HDL Ratio: 3.1 ratio (ref 0.0–4.4)
Cholesterol, Total: 178 mg/dL (ref 100–199)
HDL: 57 mg/dL
LDL Chol Calc (NIH): 104 mg/dL — ABNORMAL HIGH (ref 0–99)
Triglycerides: 92 mg/dL (ref 0–149)
VLDL Cholesterol Cal: 17 mg/dL (ref 5–40)

## 2020-03-05 ENCOUNTER — Ambulatory Visit: Payer: 59 | Admitting: Cardiology

## 2021-08-30 IMAGING — DX DG CHEST 1V PORT
1 series · 1 of 1 positions shown · non-contrast
Comparison: Radiograph 07/14/2015

CLINICAL DATA: Left upper chest pain. Left arm and jaw pain.

EXAM:
PORTABLE CHEST 1 VIEW

[chest ap]
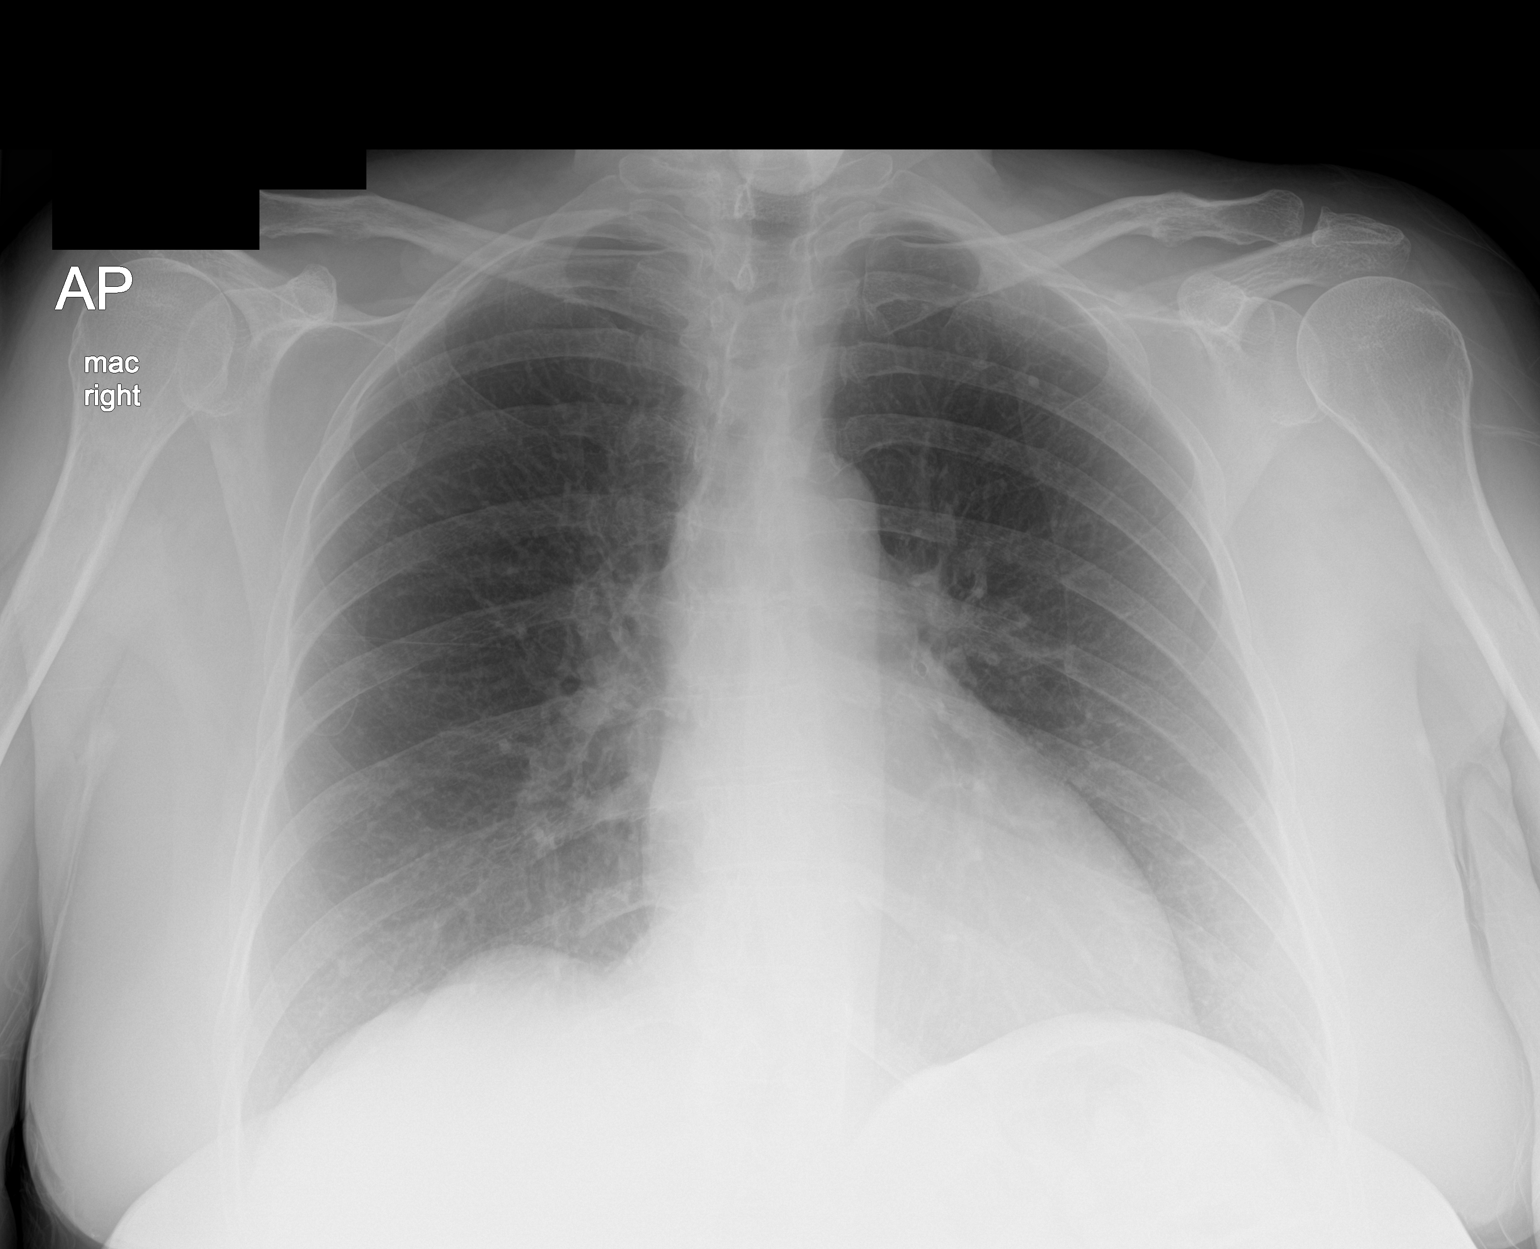

[1 of 1 positions shown; findings below may reference images not displayed]

FINDINGS: The cardiomediastinal contours are normal. Calcified granuloma in
the left upper lung, unchanged. Pulmonary vasculature is normal. No
consolidation, pleural effusion, or pneumothorax. No acute osseous
abnormalities are seen. Small surgical clips in the region of the
thoracic inlet.
IMPRESSION: No acute chest findings.

## 2023-12-27 ENCOUNTER — Encounter (HOSPITAL_BASED_OUTPATIENT_CLINIC_OR_DEPARTMENT_OTHER): Payer: Self-pay | Admitting: Emergency Medicine

## 2023-12-27 ENCOUNTER — Emergency Department (HOSPITAL_BASED_OUTPATIENT_CLINIC_OR_DEPARTMENT_OTHER)

## 2023-12-27 ENCOUNTER — Other Ambulatory Visit: Payer: Self-pay

## 2023-12-27 ENCOUNTER — Emergency Department (HOSPITAL_BASED_OUTPATIENT_CLINIC_OR_DEPARTMENT_OTHER)
Admission: EM | Admit: 2023-12-27 | Discharge: 2023-12-27 | Disposition: A | Discharge status: Home / Self Care | Attending: Emergency Medicine | Admitting: Emergency Medicine

## 2023-12-27 DIAGNOSIS — W19XXXA Unspecified fall, initial encounter: Secondary | ICD-10-CM

## 2023-12-27 DIAGNOSIS — Z7982 Long term (current) use of aspirin: Secondary | ICD-10-CM | POA: Diagnosis not present

## 2023-12-27 DIAGNOSIS — W01198A Fall on same level from slipping, tripping and stumbling with subsequent striking against other object, initial encounter: Secondary | ICD-10-CM | POA: Diagnosis not present

## 2023-12-27 DIAGNOSIS — S0003XA Contusion of scalp, initial encounter: Secondary | ICD-10-CM | POA: Diagnosis not present

## 2023-12-27 DIAGNOSIS — S0990XA Unspecified injury of head, initial encounter: Secondary | ICD-10-CM | POA: Diagnosis present

## 2023-12-27 DIAGNOSIS — Y92009 Unspecified place in unspecified non-institutional (private) residence as the place of occurrence of the external cause: Secondary | ICD-10-CM | POA: Diagnosis not present

## 2023-12-27 NOTE — Discharge Instructions (Addendum)
 You were seen in the emergency department for evaluation of head injury after a fall.  Your CAT scan did not show any obvious fracture or bleeding.  Please rest and drink plenty of fluids.  Tylenol for pain.  Ice to affected area.  Follow-up with your doctor.  Return if any worsening or concerning symptoms

## 2023-12-27 NOTE — ED Triage Notes (Signed)
 Pt reports stepping back, fell backwards and hit posterior head on cabinet.    Denies LOC. AOx4

## 2023-12-27 NOTE — ED Provider Notes (Signed)
 [place] [facility] Provider Note   CSN: [id number] Arrival date & time: 12/27/23  1644     Patient presents with: Judith Joseph ENYA BUREAU is a 63 y.o. female.  She is presenting with pain in the back of the scalp after she fell and struck her head at home today.  She said she stepped backwards and tripped over something and hit the back of her head on the floor.  She said she heard a crack and she is afraid she fractured her skull.  Complaining of some pain with palpation of her scalp.  No loss of consciousness.  Not on any blood thinners.  No vomiting blurry vision double vision numbness weakness.  Denies other complaints.  {Add pertinent medical, surgical, social history, OB history to YEP:67052} The history is provided by the patient.  Fall This is a new problem. The current episode started 1 to 2 hours ago. The problem has not changed since onset.Associated symptoms include headaches. Pertinent negatives include no chest pain, no abdominal pain and no shortness of breath. Nothing aggravates the symptoms. Nothing relieves the symptoms. She has tried nothing for the symptoms. The treatment provided no relief.       Prior to Admission medications   Medication Sig Start Date End Date Taking? Authorizing Provider  aspirin  81 MG EC tablet Take by mouth.    [provider]  buPROPion (WELLBUTRIN XL) 300 MG 24 hr tablet Take 300 mg by mouth every morning. 04/15/19   [provider]  Cholecalciferol 25 MCG (1000 UT) tablet Take by mouth.    [provider]  fluticasone (FLONASE) 50 MCG/ACT nasal spray Place 1 spray into both nostrils daily as needed. 01/07/19   [provider]  levothyroxine (SYNTHROID) 150 MCG tablet Take 150 mcg by mouth daily. 03/31/19   [provider]  metoprolol  succinate (TOPROL -XL) 25 MG 24 hr tablet Take 1 tablet (25 mg total) by mouth daily. Take with or immediately following a meal.  10/01/19 12/30/19  Bernie Lamar PARAS, MD  nitroGLYCERIN  (NITROSTAT ) 0.4 MG SL tablet Place 1 tablet (0.4 mg total) under the tongue every 5 (five) minutes as needed for chest pain. 08/14/19 11/12/19  Krasowski, Robert J, MD  omeprazole (PRILOSEC) 10 MG capsule Take 10 mg by mouth daily.    [provider]  triamcinolone (KENALOG) 0.025 % ointment Apply 1 application topically 2 (two) times daily as needed. 03/25/16   [provider]    Allergies: Ciprofloxacin    Review of Systems  Respiratory:  Negative for shortness of breath.   Cardiovascular:  Negative for chest pain.  Gastrointestinal:  Negative for abdominal pain.  Neurological:  Positive for headaches.    Updated Vital Signs BP (!) 150/111 (BP Location: Right Arm)   Pulse 92   Temp 98.4 F (36.9 C) (Oral)   Resp 16   Ht 5' 4 (1.626 m)   Wt 86.2 kg   SpO2 99%   BMI 32.61 kg/m   Physical Exam Vitals and nursing note reviewed.  Constitutional:      General: She is not in acute distress.    Appearance: Normal appearance. She is well-developed.  HENT:     Head: Normocephalic and atraumatic.  Eyes:     Conjunctiva/sclera: Conjunctivae normal.  Cardiovascular:     Rate and Rhythm: Normal rate and regular rhythm.     Heart sounds: No murmur heard. Pulmonary:     Effort: Pulmonary effort is  normal. No respiratory distress.     Breath sounds: Normal breath sounds. No stridor. No wheezing.  Abdominal:     Palpations: Abdomen is soft.     Tenderness: There is no abdominal tenderness. There is no guarding or rebound.  Musculoskeletal:        General: No deformity.     Cervical back: Neck supple.  Skin:    General: Skin is warm and dry.  Neurological:     General: No focal deficit present.     Mental Status: She is alert and oriented to person, place, and time.     GCS: GCS eye subscore is 4. GCS verbal subscore is 5. GCS motor subscore is 6.     Gait: Gait normal.     (all labs ordered are listed,  but only abnormal results are displayed) Labs Reviewed - No data to display  EKG: None  Radiology: No results found.  {Document cardiac monitor, telemetry assessment procedure when appropriate:32947} Procedures   Medications Ordered in the ED - No data to display    {Click here for ABCD2, HEART and other calculators REFRESH Note before signing:1}                              Medical Decision Making Amount and/or Complexity of Data Reviewed Radiology: ordered.   This patient complains of ***; this involves an extensive number of treatment Options and is a complaint that carries with it a high risk of complications and morbidity. The differential includes ***  I ordered, reviewed and interpreted labs, which included *** I ordered medication *** and reviewed PMP when indicated. I ordered imaging studies which included *** and I independently    visualized and interpreted imaging which showed *** Additional history obtained from *** Previous records obtained and reviewed *** I consulted *** and discussed lab and imaging findings and discussed disposition.  Cardiac monitoring reviewed, *** Social determinants considered, *** Critical Interventions: ***  After the interventions stated above, I reevaluated the patient and found *** Admission and further testing considered, ***   {Document critical care time when appropriate  Document review of labs and clinical decision tools ie CHADS2VASC2, etc  Document your independent review of radiology images and any outside records  Document your discussion with family members, caretakers and with consultants  Document social determinants of health affecting pt's care  Document your decision making why or why not admission, treatments were needed:32947:::1}   Final diagnoses:  None    ED Discharge Orders     None
